# Patient Record
Sex: Female | Born: 2000 | Hispanic: No | Marital: Single | Smoking: Never smoker
Health system: Southern US, Community
[De-identification: ages and names within clinical notes are randomized; demographics above are authoritative.]

## PROBLEM LIST (undated history)

## (undated) DIAGNOSIS — F909 Attention-deficit hyperactivity disorder, unspecified type: Secondary | ICD-10-CM

## (undated) HISTORY — PX: NASAL SEPTUM SURGERY: SHX37

## (undated) HISTORY — PX: TONSILLECTOMY: SUR1361

---
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
## 2020-10-24 ENCOUNTER — Encounter: Payer: Self-pay | Admitting: Sports Medicine

## 2020-10-30 ENCOUNTER — Ambulatory Visit: Payer: Self-pay | Admitting: Sports Medicine

## 2020-11-06 ENCOUNTER — Ambulatory Visit: Payer: Self-pay | Admitting: Sports Medicine

## 2020-11-13 ENCOUNTER — Encounter: Payer: Self-pay | Admitting: Sports Medicine

## 2020-11-13 ENCOUNTER — Other Ambulatory Visit: Payer: Self-pay

## 2020-11-13 ENCOUNTER — Ambulatory Visit (INDEPENDENT_AMBULATORY_CARE_PROVIDER_SITE_OTHER): Payer: Medicaid Other

## 2020-11-13 ENCOUNTER — Ambulatory Visit (INDEPENDENT_AMBULATORY_CARE_PROVIDER_SITE_OTHER): Payer: Medicaid Other | Admitting: Sports Medicine

## 2020-11-13 DIAGNOSIS — M25571 Pain in right ankle and joints of right foot: Secondary | ICD-10-CM

## 2020-11-13 DIAGNOSIS — M25373 Other instability, unspecified ankle: Secondary | ICD-10-CM

## 2020-11-13 DIAGNOSIS — M25472 Effusion, left ankle: Secondary | ICD-10-CM

## 2020-11-13 DIAGNOSIS — M21621 Bunionette of right foot: Secondary | ICD-10-CM | POA: Diagnosis not present

## 2020-11-13 DIAGNOSIS — M216X1 Other acquired deformities of right foot: Secondary | ICD-10-CM

## 2020-11-13 DIAGNOSIS — M25471 Effusion, right ankle: Secondary | ICD-10-CM

## 2020-11-13 DIAGNOSIS — M25572 Pain in left ankle and joints of left foot: Secondary | ICD-10-CM

## 2020-11-13 DIAGNOSIS — M21622 Bunionette of left foot: Secondary | ICD-10-CM

## 2020-11-13 DIAGNOSIS — M216X2 Other acquired deformities of left foot: Secondary | ICD-10-CM | POA: Diagnosis not present

## 2020-11-13 DIAGNOSIS — M79672 Pain in left foot: Secondary | ICD-10-CM

## 2020-11-13 DIAGNOSIS — M778 Other enthesopathies, not elsewhere classified: Secondary | ICD-10-CM

## 2020-11-13 DIAGNOSIS — M79671 Pain in right foot: Secondary | ICD-10-CM

## 2020-11-13 MED ORDER — MELOXICAM 7.5 MG PO TABS: 7.5000 mg | ORAL_TABLET | Freq: Every day | ORAL | 0 refills | Status: AC

## 2020-11-13 NOTE — Progress Notes (Signed)
Subjective:  Rebecca Baxter is a 20 y.o. female patient who presents to office for evaluation of bilateral ankle pain and pain at 5th toe joints L>R. Patient complains of continued pain in the ankle ankles since middle school with sharp pains and weakness.  Reports that her ankles give out easily states that she used to play basketball and had multiple injuries and ankle sprains. Patient has tried over-the-counter braces per mom in the past and heel cushions when she was seen by another doctor with no relief in symptoms. Patient denies any other pedal complaints. Denies recent injury/trip/fall/sprain/any causative factors.  Review of systems noncontributory.   Patient Active Problem List   Diagnosis Date Noted  . Tonsillitis, chronic 08/17/2019  . Bleeding in early pregnancy 08/17/2019  . Pelvic pain affecting pregnancy in first trimester, antepartum 08/17/2019    Current Outpatient Medications on File Prior to Visit  Medication Sig Dispense Refill  . acetaminophen (TYLENOL) 325 MG tablet Take 2 tablets (650 mg total) by mouth every 6 (six) hours as needed for mild pain, moderate pain or fever. 30 tablet 0  . amphetamine-dextroamphetamine (ADDERALL) 30 MG tablet Take 1 tablet by mouth daily.    . cyclobenzaprine (FLEXERIL) 10 MG tablet Take 1 tablet (10 mg total) by mouth 2 (two) times daily as needed for muscle spasms. 10 tablet 0  . fluconazole (DIFLUCAN) 150 MG tablet Take 150 mg by mouth every 3 (three) days.    Marland Kitchen HYDROcodone-acetaminophen (HYCET) 7.5-325 mg/15 ml solution Take by mouth.    Marland Kitchen ibuprofen (ADVIL) 800 MG tablet Take 1 tablet (800 mg total) by mouth 3 (three) times daily. 21 tablet 0  . [facility] 1/35 1-35 MG-MCG tablet Take 1 tablet by mouth daily.    . nitrofurantoin (MACRODANTIN) 100 MG capsule Take 100 mg by mouth 2 (two) times daily.    . ondansetron (ZOFRAN ODT) 4 MG disintegrating tablet Take 1 tablet (4 mg total) by mouth every 8 (eight) hours as needed for nausea or  vomiting. 20 tablet 0  . QUEtiapine (SEROQUEL) 200 MG tablet Take 200 mg by mouth at bedtime.    Marland Kitchen terconazole (TERAZOL 7) 0.4 % vaginal cream Place 1 applicator vaginally at bedtime. 45 g 0   No current facility-administered medications on file prior to visit.    Allergies  Allergen Reactions  . Sulfa Antibiotics Hives and Swelling    Objective:  General: Alert and oriented x3 in no acute distress  Dermatology: No open lesions bilateral lower extremities, no webspace macerations, no ecchymosis bilateral, all nails x 10 are well manicured.  Vascular: Dorsalis Pedis and Posterior Tibial pedal pulses palpable, Capillary Fill Time 3 seconds,(+) pedal hair growth bilateral,trace edema bilateral lower extremities, Temperature gradient within normal limits. + varicosities at ankles.   Neurology: Michaell Cowing sensation intact via light touch bilateral.  Musculoskeletal: No reproducible tenderness to palpation to bilateral ankles at this time. Negative talar tilt, Negative tib-fib stress, subjective instability. No pain with calf compression bilateral. Range of motion within normal limits with mild guarding on ankles and mild guarding with pain to palpation to the fifth metatarsophalangeal joints left greater than right.  Splayfoot type wider at the forefoot as compared to the heel.  Strength within normal limits in all groups bilateral with mild guarding as above.   Gait: Mild antalgic gait  Xrays  Left and right ankle/foot   Impression: No acute osseous findings  Assessment and Plan: Problem List Items Addressed This Visit   None   Visit Diagnoses  Acute bilateral ankle pain    -  Primary   Relevant Orders   DG Foot Complete Right   DG Foot Complete Left   Unstable ankle, unspecified laterality       Swelling of both ankles       Tailor's bunion of both feet       Capsulitis of foot, left       Foot pain, bilateral           -Complete examination performed -Xrays  reviewed -Discussed treatement options for chronic ankle instability and pain and capsulitis at tailor's bunion -Rx meloxicam -Dispensed Surgigrip compression sleeve to wear during the day to help with supporting ankles and to help with swelling -Recommend good supportive shoes daily for foot type -Advised patient to avoid uneven surfaces or high impact activity that could add to worsening of symptoms -Dispensed tailor bunion shield for patient to use as directed when in shoes and to avoid shoes that could be too narrow that could cause rubbing at tailor's bunions -Patient to return to office in 1 month or sooner if condition worsens.  Asencion Islam, DPM

## 2020-11-15 ENCOUNTER — Other Ambulatory Visit: Payer: Self-pay | Admitting: Sports Medicine

## 2020-11-15 DIAGNOSIS — M21621 Bunionette of right foot: Secondary | ICD-10-CM

## 2020-11-15 DIAGNOSIS — M25373 Other instability, unspecified ankle: Secondary | ICD-10-CM

## 2020-12-14 ENCOUNTER — Ambulatory Visit: Payer: Medicaid Other | Admitting: Sports Medicine

## 2020-12-19 ENCOUNTER — Encounter: Payer: Self-pay | Admitting: Sports Medicine

## 2020-12-19 ENCOUNTER — Other Ambulatory Visit: Payer: Self-pay

## 2020-12-19 ENCOUNTER — Ambulatory Visit (INDEPENDENT_AMBULATORY_CARE_PROVIDER_SITE_OTHER): Payer: Medicaid Other | Admitting: Sports Medicine

## 2020-12-19 DIAGNOSIS — G8929 Other chronic pain: Secondary | ICD-10-CM

## 2020-12-19 DIAGNOSIS — M25373 Other instability, unspecified ankle: Secondary | ICD-10-CM

## 2020-12-19 DIAGNOSIS — M25472 Effusion, left ankle: Secondary | ICD-10-CM

## 2020-12-19 DIAGNOSIS — M79671 Pain in right foot: Secondary | ICD-10-CM

## 2020-12-19 DIAGNOSIS — M25572 Pain in left ankle and joints of left foot: Secondary | ICD-10-CM

## 2020-12-19 DIAGNOSIS — M79672 Pain in left foot: Secondary | ICD-10-CM

## 2020-12-19 DIAGNOSIS — M21621 Bunionette of right foot: Secondary | ICD-10-CM

## 2020-12-19 DIAGNOSIS — M21622 Bunionette of left foot: Secondary | ICD-10-CM

## 2020-12-19 DIAGNOSIS — M25571 Pain in right ankle and joints of right foot: Secondary | ICD-10-CM | POA: Diagnosis not present

## 2020-12-19 DIAGNOSIS — M25471 Effusion, right ankle: Secondary | ICD-10-CM | POA: Diagnosis not present

## 2020-12-19 DIAGNOSIS — M19079 Primary osteoarthritis, unspecified ankle and foot: Secondary | ICD-10-CM | POA: Diagnosis not present

## 2020-12-19 MED ORDER — PREDNISONE 10 MG (21) PO TBPK: ORAL_TABLET | ORAL | 0 refills | Status: AC

## 2020-12-19 NOTE — Progress Notes (Signed)
Subjective:  Rebecca Baxter is a 20 y.o. female patient who presents to office for evaluation of bilateral ankle pain and pain at 5th toe joints today R>L. Patient reports that pain is the same. Gets sharp pains in her ankles and the bunions are sore and swell R>L. Stopped mobic after 2 weeks because it wasn't helping.  Patient is assisted by mom this visit.  Patient Active Problem List   Diagnosis Date Noted   Tonsillitis, chronic 08/17/2019   Bleeding in early pregnancy 08/17/2019   Pelvic pain affecting pregnancy in first trimester, antepartum 08/17/2019    Current Outpatient Medications on File Prior to Visit  Medication Sig Dispense Refill   acetaminophen (TYLENOL) 325 MG tablet Take 2 tablets (650 mg total) by mouth every 6 (six) hours as needed for mild pain, moderate pain or fever. 30 tablet 0   amphetamine-dextroamphetamine (ADDERALL) 30 MG tablet Take 1 tablet by mouth daily.     cyclobenzaprine (FLEXERIL) 10 MG tablet Take 1 tablet (10 mg total) by mouth 2 (two) times daily as needed for muscle spasms. 10 tablet 0   fluconazole (DIFLUCAN) 150 MG tablet Take 150 mg by mouth every 3 (three) days.     HYDROcodone-acetaminophen (HYCET) 7.5-325 mg/15 ml solution Take by mouth.     ibuprofen (ADVIL) 800 MG tablet Take 1 tablet (800 mg total) by mouth 3 (three) times daily. 21 tablet 0   KELNOR 1/35 1-35 MG-MCG tablet Take 1 tablet by mouth daily.     meloxicam (MOBIC) 7.5 MG tablet Take 1 tablet (7.5 mg total) by mouth daily. 30 tablet 0   nitrofurantoin (MACRODANTIN) 100 MG capsule Take 100 mg by mouth 2 (two) times daily.     ondansetron (ZOFRAN ODT) 4 MG disintegrating tablet Take 1 tablet (4 mg total) by mouth every 8 (eight) hours as needed for nausea or vomiting. 20 tablet 0   QUEtiapine (SEROQUEL) 200 MG tablet Take 200 mg by mouth at bedtime.     terconazole (TERAZOL 7) 0.4 % vaginal cream Place 1 applicator vaginally at bedtime. 45 g 0   No current facility-administered  medications on file prior to visit.    Allergies  Allergen Reactions   Sulfa Antibiotics Hives and Swelling    Objective:  General: Alert and oriented x3 in no acute distress  Dermatology: No open lesions bilateral lower extremities, no webspace macerations, no ecchymosis bilateral, all nails x 10 are well manicured.  Vascular: Dorsalis Pedis and Posterior Tibial pedal pulses palpable, Capillary Fill Time 3 seconds,(+) pedal hair growth bilateral,trace edema bilateral lower extremities, Temperature gradient within normal limits. + varicosities at ankles.   Neurology: Johney Maine sensation intact via light touch bilateral.  Musculoskeletal: + reproducible tenderness to palpation to bilateral ankles lateral at ATFL/sinus tarsi. Negative talar tilt, Negative tib-fib stress, subjective instability. No pain with calf compression bilateral. Range of motion within normal limits with mild guarding on ankles and mild guarding with pain to palpation to the fifth metatarsophalangeal joints R>L. Splayfoot type wider at the forefoot as compared to the heel.  Strength within normal limits in all groups bilateral with mild guarding as above.   Assessment and Plan: Problem List Items Addressed This Visit   None Visit Diagnoses     Arthritis of foot    -  Primary   Relevant Medications   predniSONE (STERAPRED UNI-PAK 21 TAB) 10 MG (21) TBPK tablet   Other Relevant Orders   Uric acid   Sedimentation rate   C-reactive protein  Rheumatoid factor   HLA-B27 antigen   CBC with Differential/Platelet   ANA, IFA (with reflex)   Unstable ankle, unspecified laterality       Relevant Orders   MR ANKLE LEFT WO CONTRAST   MR ANKLE RIGHT WO CONTRAST   Chronic pain of both ankles       Relevant Orders   MR ANKLE LEFT WO CONTRAST   MR ANKLE RIGHT WO CONTRAST   Swelling of both ankles       Relevant Orders   MR ANKLE LEFT WO CONTRAST   MR ANKLE RIGHT WO CONTRAST   Tailor's bunion of both feet       Foot  pain, bilateral          -Complete examination performed -Re-Discussed treatement options for chronic ankle instability and pain and capsulitis at tailor's bunion -Rx arthritic panel -Rx Prednisone -Rx MRI to eval ankles for chronic sprain injury/tear with instability -Recommend good supportive shoes daily for foot type -Advised good supportive shoes that do not rub -Patient to return to office after MRI and blood work or sooner if condition worsens.  Landis Martins, DPM

## 2021-01-04 ENCOUNTER — Other Ambulatory Visit: Payer: Medicaid Other

## 2021-01-31 ENCOUNTER — Ambulatory Visit
Admission: RE | Admit: 2021-01-31 | Discharge: 2021-01-31 | Disposition: A | Discharge status: Home / Self Care | Payer: Medicaid Other | Source: Ambulatory Visit | Attending: Sports Medicine | Admitting: Sports Medicine

## 2021-01-31 ENCOUNTER — Other Ambulatory Visit: Payer: Self-pay

## 2021-01-31 DIAGNOSIS — G8929 Other chronic pain: Secondary | ICD-10-CM

## 2021-01-31 DIAGNOSIS — M25571 Pain in right ankle and joints of right foot: Secondary | ICD-10-CM

## 2021-01-31 DIAGNOSIS — M25373 Other instability, unspecified ankle: Secondary | ICD-10-CM

## 2021-01-31 DIAGNOSIS — M25471 Effusion, right ankle: Secondary | ICD-10-CM

## 2021-01-31 DIAGNOSIS — M25472 Effusion, left ankle: Secondary | ICD-10-CM

## 2021-01-31 IMAGING — MR MR ANKLE*R* W/O CM
5 series · 38 of 40 positions shown · non-contrast
Comparison: X-ray [DATE]

CLINICAL DATA: Ankle pain, instability suspected, neg xray. Lateral
malleolar pain

EXAM:
MRI OF THE RIGHT ANKLE WITHOUT CONTRAST
TECHNIQUE: Multiplanar, multisequence MR imaging of the ankle was performed. No
intravenous contrast was administered.

[Series 3: T2 fat-sat · axial · 3.0mm · 0.50mm/px · z∈[-58,+63]mm · 9 of 32 slices shown (1 of 2)]
[im 1/32]
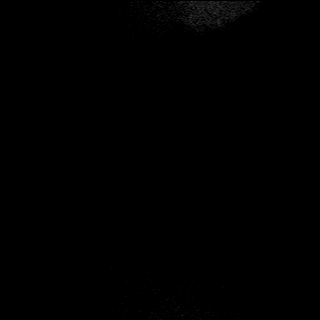
[im 4/32]
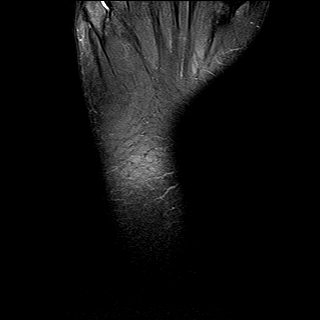
[im 8/32]
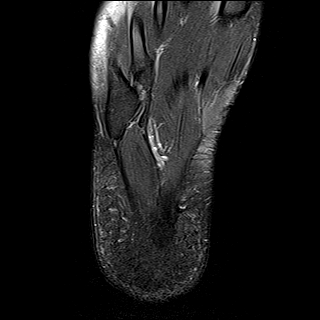
[im 12/32]
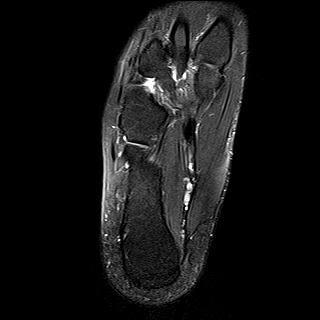
[im 16/32]
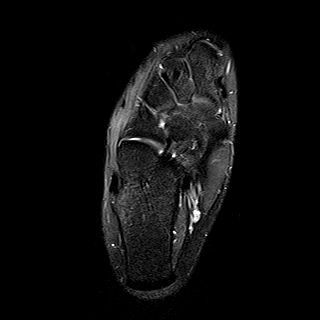
[im 20/32]
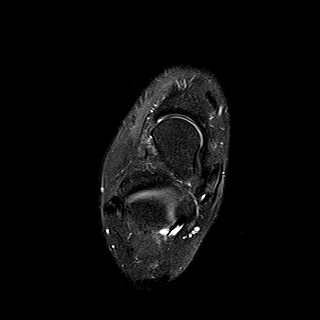
[im 24/32]
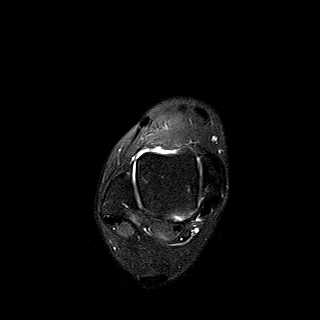
[im 28/32]
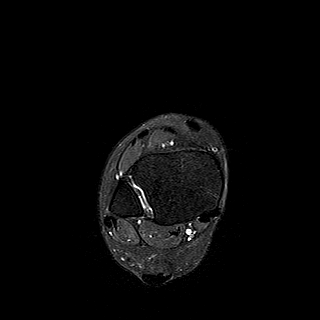
[im 32/32]
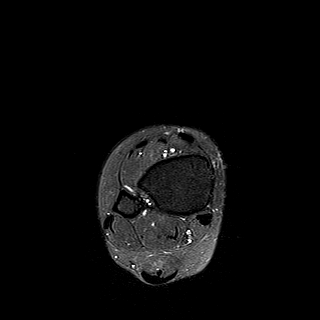

[Series 4: PD fat-sat · axial · 3.0mm · 0.50mm/px · z∈[-58,+63]mm · 9 of 32 slices shown]
[im 1/32]
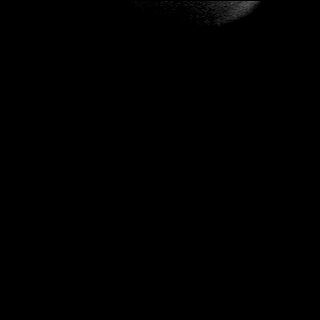
[im 4/32]
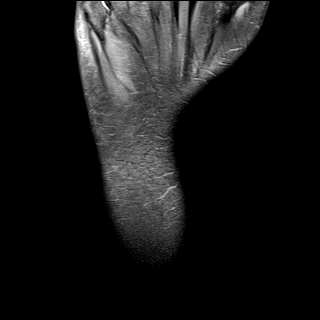
[im 8/32]
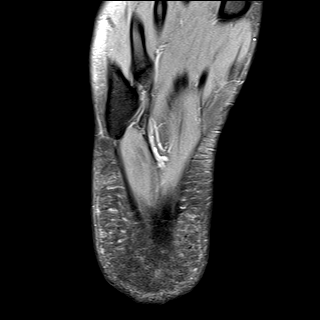
[im 12/32]
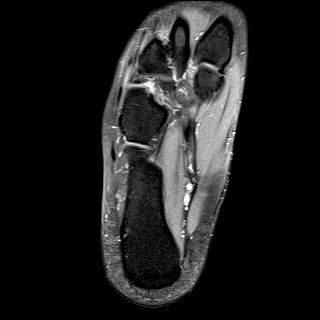
[im 16/32]
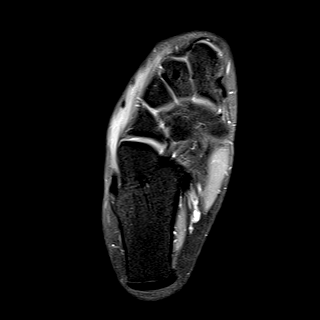
[im 20/32]
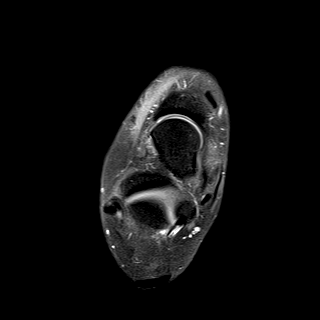
[im 24/32]
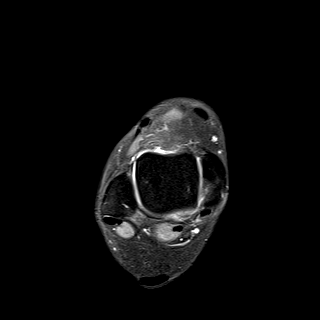
[im 28/32]
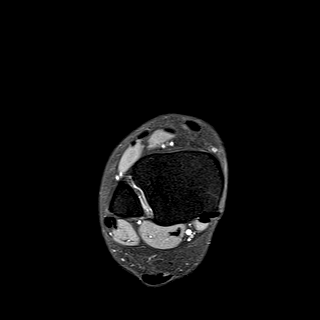
[im 32/32]
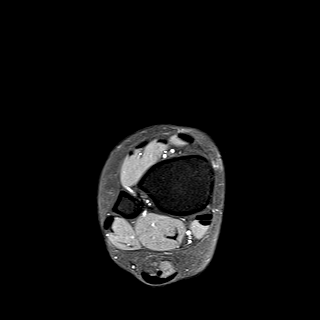

[Series 5: T1 · sagittal · 4.0mm · 0.56mm/px · 6 of 19 slices shown]
[im 1/19]
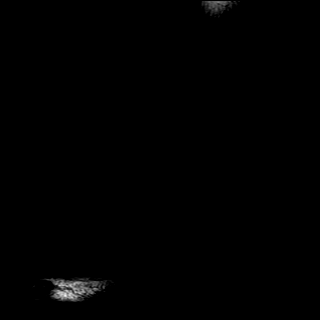
[im 4/19]
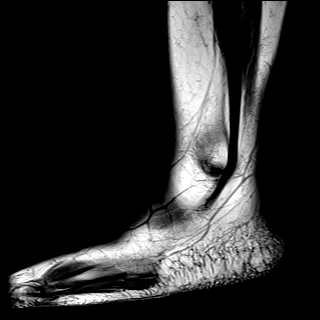
[im 8/19]
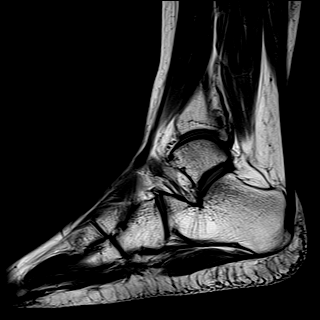
[im 11/19]
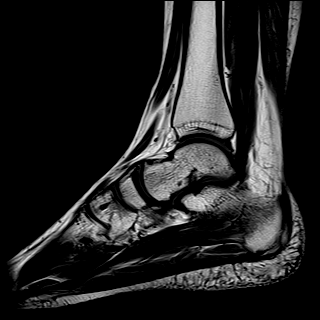
[im 15/19]
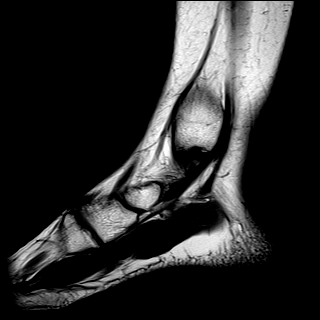
[im 19/19]
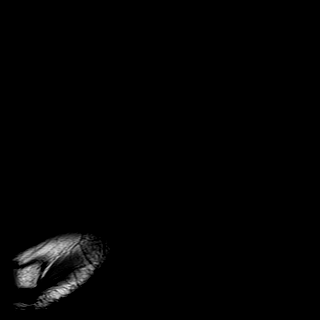

[Series 6: STIR · sagittal · 4.0mm · 0.35mm/px · 6 of 19 slices shown]
[im 1/19]
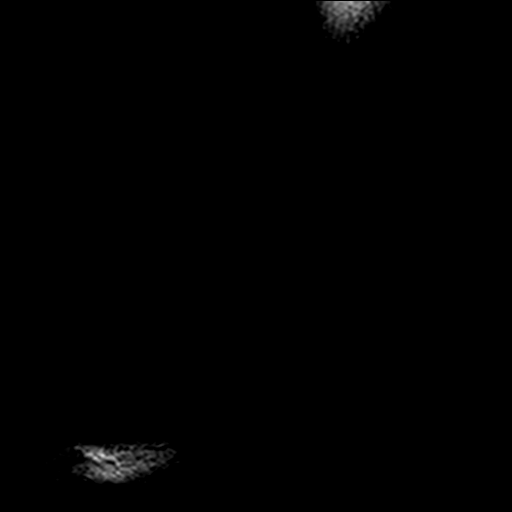
[im 4/19]
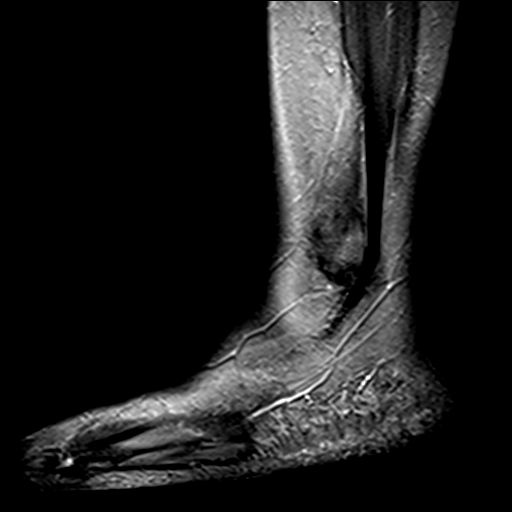
[im 8/19]
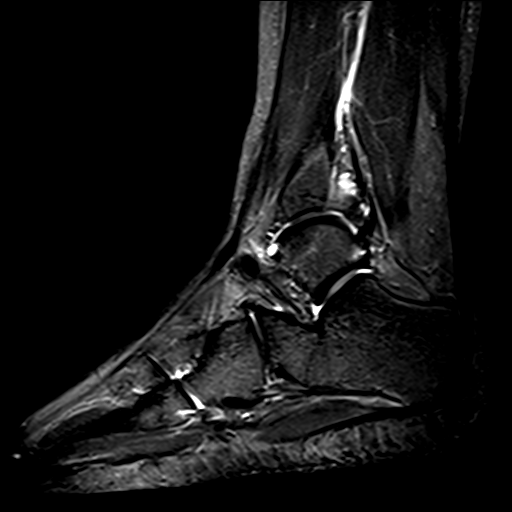
[im 11/19]
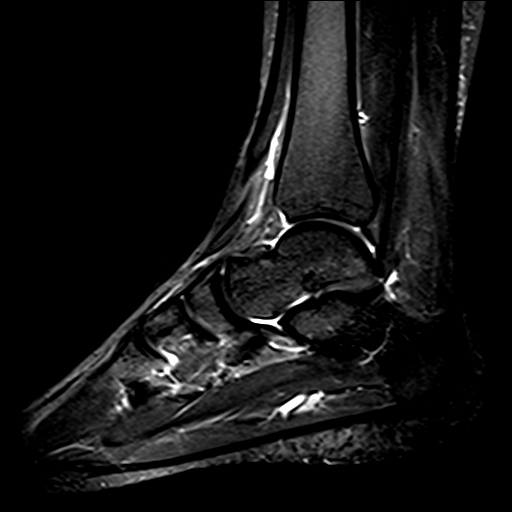
[im 15/19]
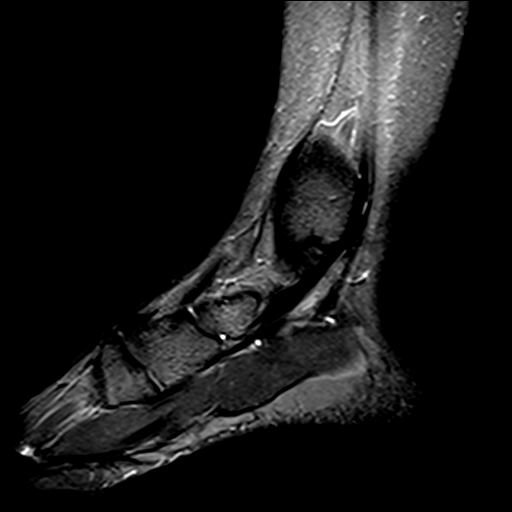
[im 19/19]
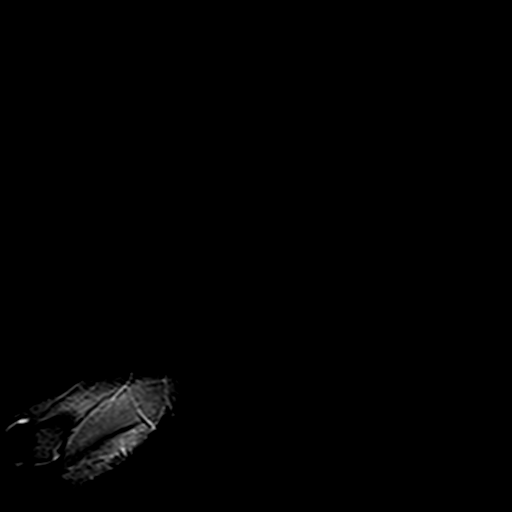

[Series 7: T2 fat-sat · coronal · 3.0mm · 0.50mm/px · 8 of 35 slices shown (2 of 2)]
[im 1/35]
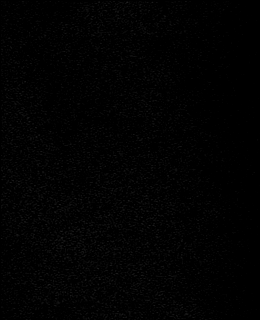
[im 4/35]
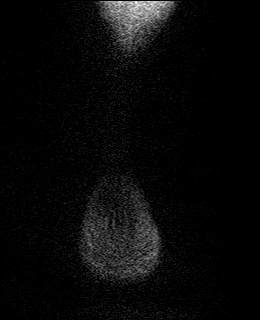
[im 12/35]
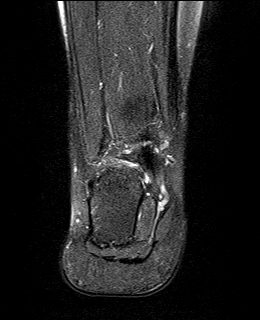
[im 16/35]
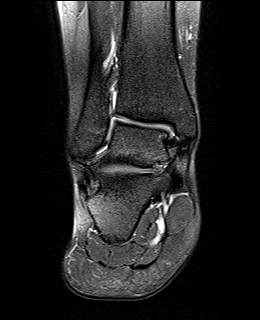
[im 19/35]
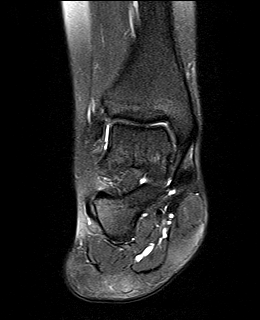
[im 23/35]
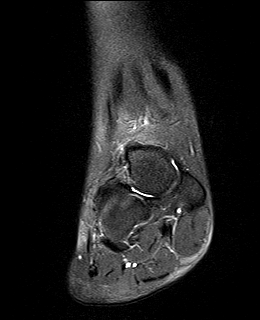
[im 31/35]
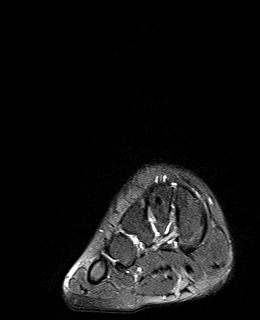
[im 35/35]
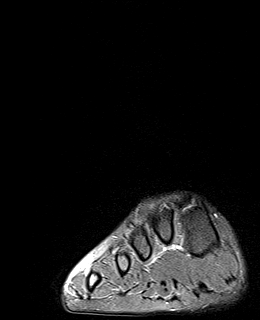

[38 of 40 positions shown; findings below may reference images not displayed]

FINDINGS: TENDONS

Peroneal: Intact peroneus longus and peroneus brevis tendons.

Posteromedial: Intact tibialis posterior, flexor hallucis longus and
flexor digitorum longus tendons.

Anterior: Intact tibialis anterior, extensor hallucis longus and
extensor digitorum longus tendons.

Achilles: Intact.

Plantar Fascia: Intact.

LIGAMENTS

Lateral: Chronically torn anterior talofibular ligament.
Calcaneofibular ligament and posterior talofibular ligaments are
intact but thickened, suggesting sequela of prior trauma. Intact
anterior and posterior tibiofibular ligaments.

Medial: Deltoid ligament and spring ligament complex intact.

CARTILAGE

Ankle Joint: No joint effusion or chondral defect.

Subtalar Joints/Sinus Tarsi: No joint effusion or chondral defect.
Preservation of the anatomic fat within the sinus tarsi.

Bones: No marrow signal abnormality. No fracture or dislocation.

Other: No soft tissue edema or fluid collection.
IMPRESSION: Right ankle:

1. No acute osseous abnormality or evidence of acute ligamentous
injury of the right ankle.
2. Findings compatible with sequela of prior ankle inversion injury
with chronically torn ATFL. The calcaneofibular and posterior
talofibular ligaments are intact but thickened.

## 2021-01-31 IMAGING — MR MR ANKLE*L* W/O CM
5 series · 40 of 40 positions shown · non-contrast
Comparison: X-ray [DATE]

CLINICAL DATA: Ankle pain, instability suspected, neg xray. Pain at
the lateral malleolus

EXAM:
MRI OF THE LEFT ANKLE WITHOUT CONTRAST
TECHNIQUE: Multiplanar, multisequence MR imaging of the ankle was performed. No
intravenous contrast was administered.

[Series 3: T2 fat-sat · axial · 3.0mm · 0.50mm/px · z∈[-51,+69]mm · 10 of 32 slices shown (1 of 2)]
[im 1/32]
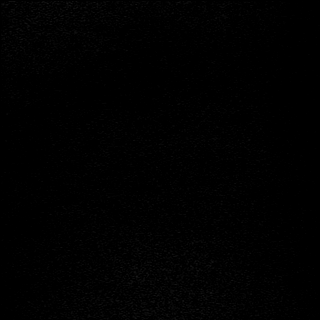
[im 4/32]
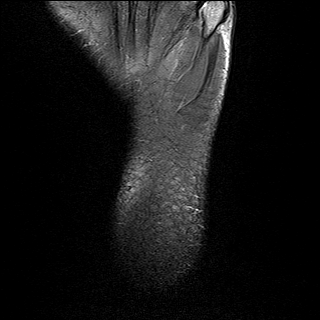
[im 7/32]
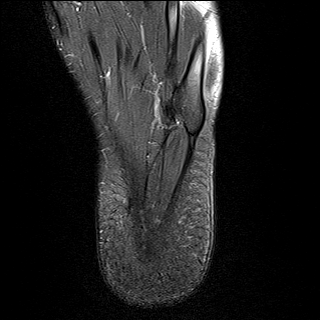
[im 11/32]
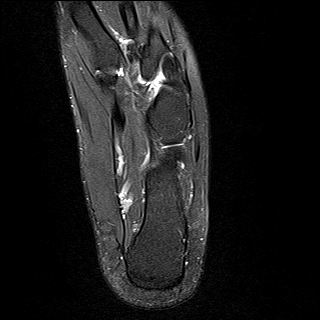
[im 14/32]
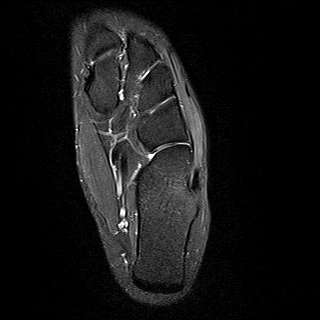
[im 18/32]
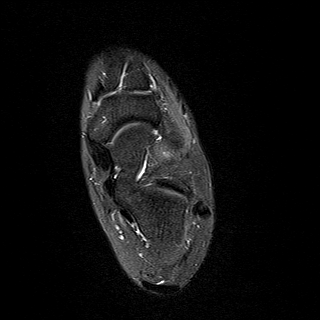
[im 21/32]
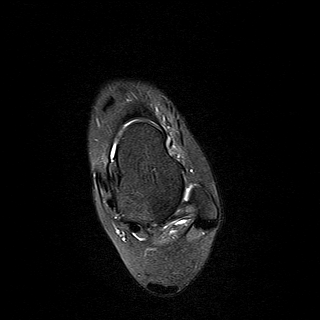
[im 25/32]
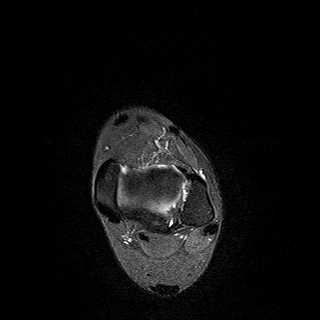
[im 28/32]
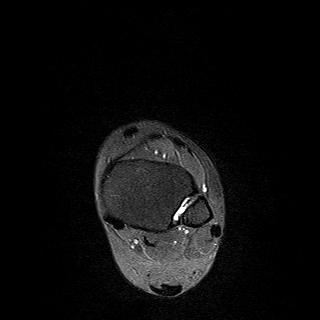
[im 32/32]
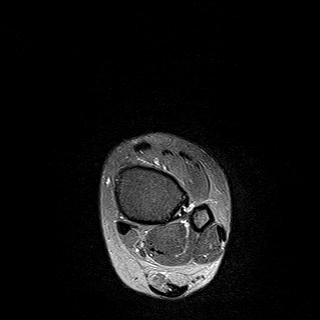

[Series 4: PD fat-sat · axial · 3.0mm · 0.50mm/px · z∈[-51,+69]mm · 10 of 32 slices shown]
[im 1/32]
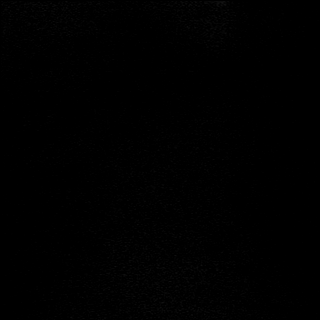
[im 4/32]
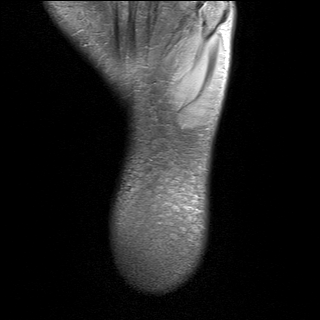
[im 7/32]
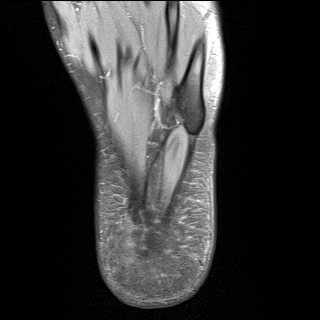
[im 11/32]
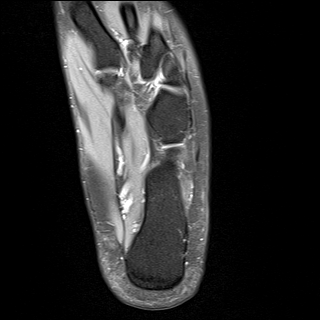
[im 14/32]
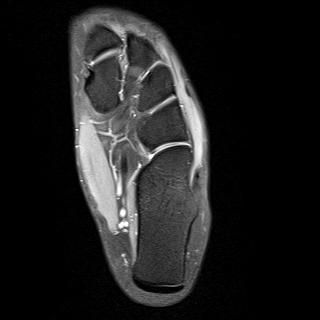
[im 18/32]
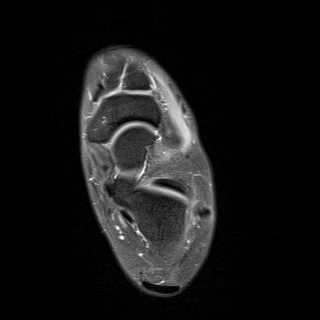
[im 21/32]
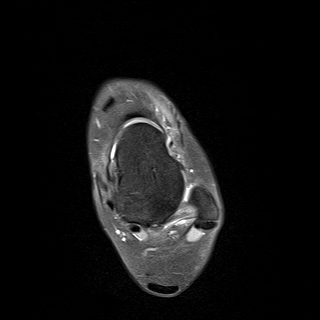
[im 25/32]
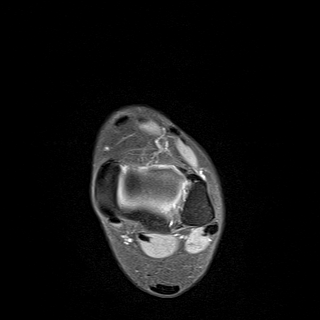
[im 28/32]
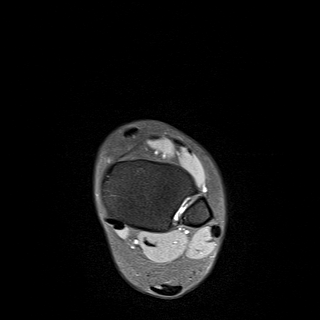
[im 32/32]
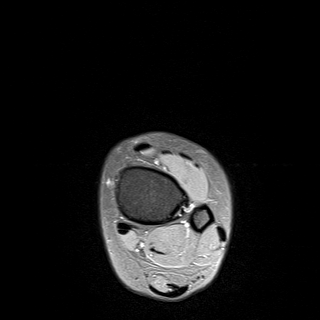

[Series 5: T1 · sagittal · 4.0mm · 0.56mm/px · 5 of 18 slices shown]
[im 1/18]
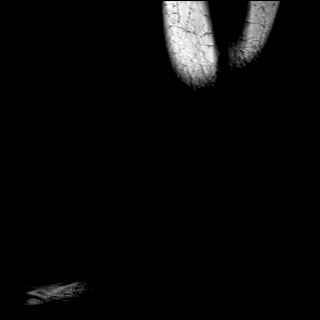
[im 5/18]
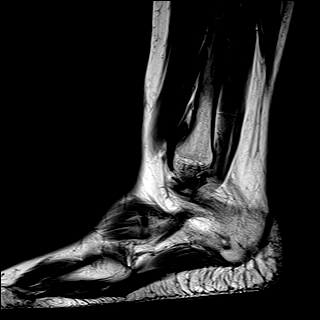
[im 9/18]
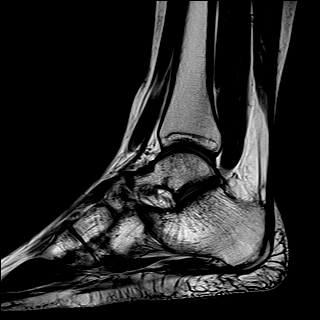
[im 13/18]
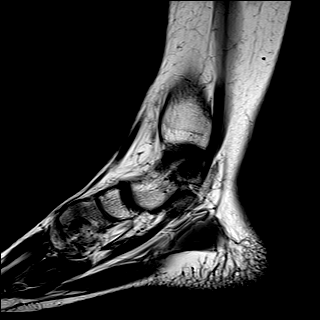
[im 18/18]
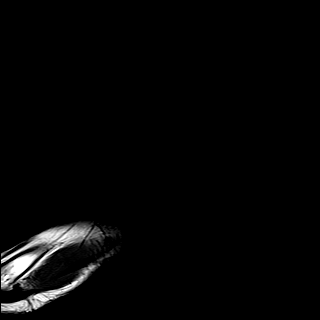

[Series 6: STIR · sagittal · 4.0mm · 0.35mm/px · 5 of 18 slices shown]
[im 1/18]
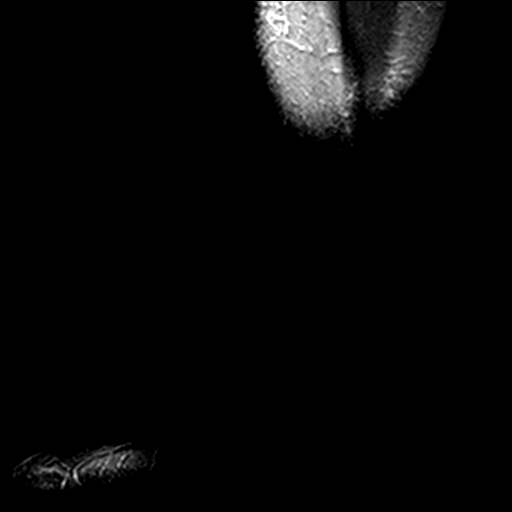
[im 5/18]
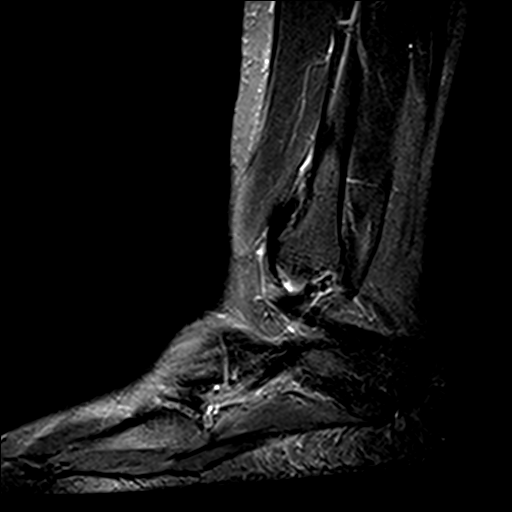
[im 9/18]
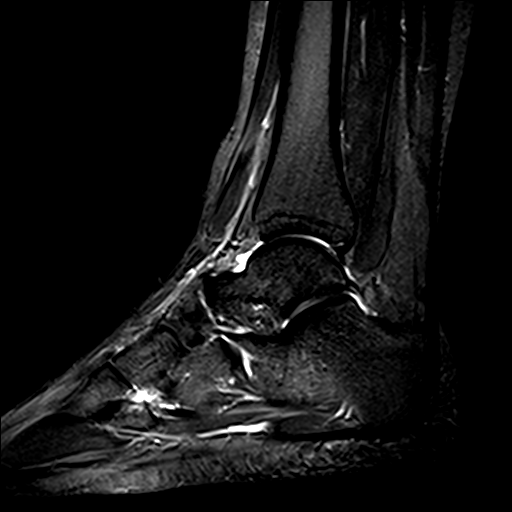
[im 13/18]
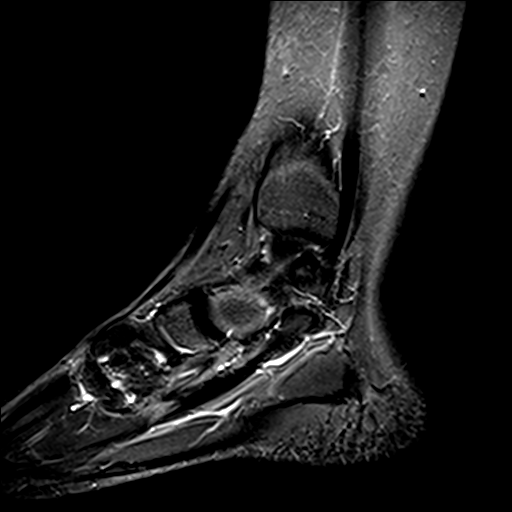
[im 18/18]
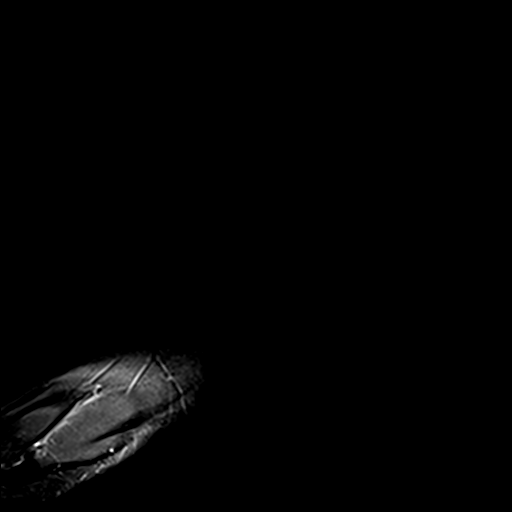

[Series 7: T2 fat-sat · coronal · 3.0mm · 0.50mm/px · 10 of 32 slices shown (2 of 2)]
[im 1/32]
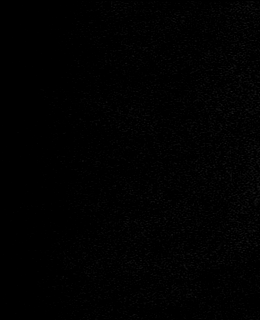
[im 4/32]
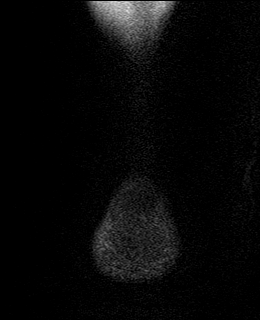
[im 7/32]
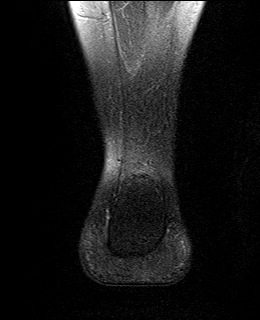
[im 11/32]
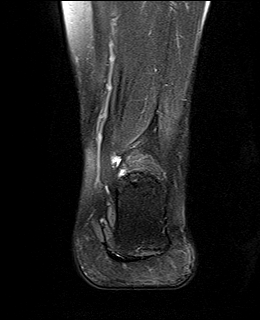
[im 14/32]
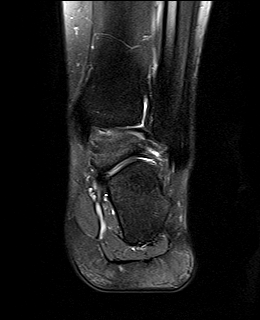
[im 18/32]
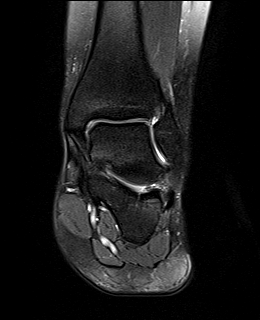
[im 21/32]
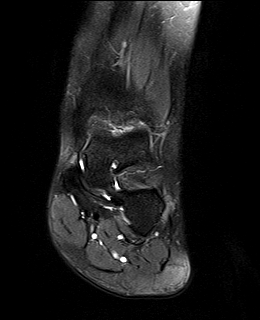
[im 25/32]
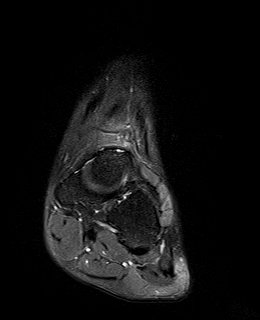
[im 28/32]
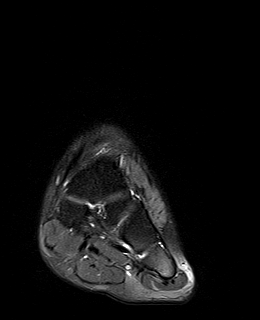
[im 32/32]
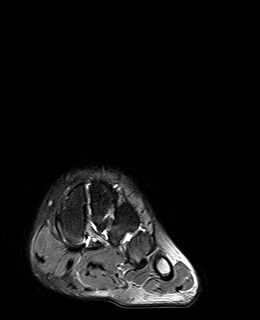

[40 of 40 positions shown; findings below may reference images not displayed]

FINDINGS: TENDONS

Peroneal: Intact peroneus longus and peroneus brevis tendons.

Posteromedial: Intact tibialis posterior, flexor hallucis longus and
flexor digitorum longus tendons.

Anterior: Intact tibialis anterior, extensor hallucis longus and
extensor digitorum longus tendons.

Achilles: Intact.

Plantar Fascia: Intact.

LIGAMENTS

Lateral: Anterior talofibular ligament and calcaneofibular ligaments
are markedly attenuated suggesting sequela of remote trauma. No
periligamentous edema. Posterior talofibular ligament is intact but
mildly thickened. Anterior and posterior tibiofibular ligaments are
intact.

Medial: Deltoid ligament and spring ligament complex intact.

CARTILAGE

Ankle Joint: No joint effusion or chondral defect.

Subtalar Joints/Sinus Tarsi: No joint effusion or chondral defect.
Preservation of the anatomic fat within the sinus tarsi.

Bones: No marrow signal abnormality. No fracture or dislocation.

Other: No soft tissue edema or fluid collection.
IMPRESSION: Left ankle:

1. No acute osseous abnormality or evidence of acute ligamentous
injury of the left ankle.
2. Findings suggest sequela of prior ankle inversion injury with
attenuated appearance of the ATFL and calcaneofibular ligament.
Posterior talofibular ligament is intact but mildly thickened.

## 2022-12-05 ENCOUNTER — Encounter (HOSPITAL_COMMUNITY): Payer: Self-pay | Admitting: Emergency Medicine

## 2022-12-05 ENCOUNTER — Emergency Department (HOSPITAL_COMMUNITY)
Admission: EM | Admit: 2022-12-05 | Discharge: 2022-12-05 | Disposition: A | Discharge status: Home / Self Care | Payer: Medicaid Other | Attending: Emergency Medicine | Admitting: Emergency Medicine

## 2022-12-05 ENCOUNTER — Other Ambulatory Visit: Payer: Self-pay

## 2022-12-05 ENCOUNTER — Emergency Department (HOSPITAL_COMMUNITY): Payer: Medicaid Other

## 2022-12-05 DIAGNOSIS — R1032 Left lower quadrant pain: Secondary | ICD-10-CM | POA: Diagnosis not present

## 2022-12-05 DIAGNOSIS — R112 Nausea with vomiting, unspecified: Secondary | ICD-10-CM | POA: Diagnosis not present

## 2022-12-05 DIAGNOSIS — R1012 Left upper quadrant pain: Secondary | ICD-10-CM | POA: Diagnosis not present

## 2022-12-05 DIAGNOSIS — E876 Hypokalemia: Secondary | ICD-10-CM | POA: Diagnosis not present

## 2022-12-05 DIAGNOSIS — E860 Dehydration: Secondary | ICD-10-CM | POA: Insufficient documentation

## 2022-12-05 DIAGNOSIS — R109 Unspecified abdominal pain: Secondary | ICD-10-CM | POA: Diagnosis present

## 2022-12-05 HISTORY — DX: Attention-deficit hyperactivity disorder, unspecified type: F90.9

## 2022-12-05 LAB — COMPREHENSIVE METABOLIC PANEL
ALT: 29 U/L (ref 0–44)
AST: 34 U/L (ref 15–41)
Albumin: 3.6 g/dL (ref 3.5–5.0)
Alkaline Phosphatase: 75 U/L (ref 38–126)
Anion gap: 16 — ABNORMAL HIGH (ref 5–15)
BUN: 8 mg/dL (ref 6–20)
CO2: 19 mmol/L — ABNORMAL LOW (ref 22–32)
Calcium: 9.2 mg/dL (ref 8.9–10.3)
Chloride: 103 mmol/L (ref 98–111)
Creatinine, Ser: 0.83 mg/dL (ref 0.44–1.00)
GFR, Estimated: >60 mL/min (ref >60)
Glucose, Bld: 109 mg/dL — ABNORMAL HIGH (ref 70–99)
Potassium: 3.4 mmol/L — ABNORMAL LOW (ref 3.5–5.1)
Sodium: 138 mmol/L (ref 135–145)
Total Bilirubin: 0.5 mg/dL (ref 0.3–1.2)
Total Protein: 7.2 g/dL (ref 6.5–8.1)

## 2022-12-05 LAB — URINALYSIS, ROUTINE W REFLEX MICROSCOPIC
Bilirubin Urine: NEGATIVE
Glucose, UA: NEGATIVE mg/dL
Hgb urine dipstick: NEGATIVE
Ketones, ur: NEGATIVE mg/dL
Leukocytes,Ua: NEGATIVE
Nitrite: NEGATIVE
Protein, ur: NEGATIVE mg/dL
Specific Gravity, Urine: 1.016 (ref 1.005–1.030)
pH: 6 (ref 5.0–8.0)

## 2022-12-05 LAB — CBC
HCT: 38.3 % (ref 36.0–46.0)
Hemoglobin: 12.2 g/dL (ref 12.0–15.0)
MCH: 26.3 pg (ref 26.0–34.0)
MCHC: 31.9 g/dL (ref 30.0–36.0)
MCV: 82.7 fL (ref 80.0–100.0)
Platelets: 377 10*3/uL (ref 150–400)
RBC: 4.63 MIL/uL (ref 3.87–5.11)
RDW: 13.9 % (ref 11.5–15.5)
WBC: 11.9 10*3/uL — ABNORMAL HIGH (ref 4.0–10.5)
nRBC: 0 % (ref 0.0–0.2)

## 2022-12-05 LAB — I-STAT BETA HCG BLOOD, ED (MC, WL, AP ONLY): I-stat hCG, quantitative: <5 m[IU]/mL (ref <5)

## 2022-12-05 LAB — LIPASE, BLOOD: Lipase: 31 U/L (ref 11–51)

## 2022-12-05 MED ORDER — DICYCLOMINE HCL 10 MG/ML IM SOLN
20.0000 mg | Freq: Once | INTRAMUSCULAR | Status: AC
  Administered 2022-12-05: 20 mg via INTRAMUSCULAR
  Filled 2022-12-05: qty 2

## 2022-12-05 MED ORDER — ONDANSETRON HCL 4 MG/2ML IJ SOLN
4.0000 mg | Freq: Once | INTRAMUSCULAR | Status: AC
  Administered 2022-12-05: 4 mg via INTRAVENOUS
  Filled 2022-12-05: qty 2

## 2022-12-05 MED ORDER — SODIUM CHLORIDE 0.9 % IV BOLUS
1000.0000 mL | Freq: Once | INTRAVENOUS | Status: AC
  Administered 2022-12-05: 1000 mL via INTRAVENOUS

## 2022-12-05 MED ORDER — PROMETHAZINE HCL 25 MG PO TABS
25.0000 mg | ORAL_TABLET | Freq: Once | ORAL | Status: AC
  Administered 2022-12-05: 25 mg via ORAL
  Filled 2022-12-05: qty 1

## 2022-12-05 MED ORDER — IOHEXOL 350 MG/ML SOLN
75.0000 mL | Freq: Once | INTRAVENOUS | Status: AC | PRN
  Administered 2022-12-05: 75 mL via INTRAVENOUS

## 2022-12-05 MED ORDER — PROMETHAZINE HCL 25 MG PO TABS: 25.0000 mg | ORAL_TABLET | Freq: Three times a day (TID) | ORAL | 0 refills | Status: AC | PRN

## 2022-12-05 MED ORDER — POTASSIUM CHLORIDE CRYS ER 20 MEQ PO TBCR
40.0000 meq | EXTENDED_RELEASE_TABLET | Freq: Once | ORAL | Status: AC
  Administered 2022-12-05: 40 meq via ORAL
  Filled 2022-12-05: qty 2

## 2022-12-05 MED ORDER — DICYCLOMINE HCL 20 MG PO TABS: 20.0000 mg | ORAL_TABLET | Freq: Three times a day (TID) | ORAL | 0 refills | Status: AC | PRN

## 2022-12-05 MED ORDER — KETOROLAC TROMETHAMINE 15 MG/ML IJ SOLN
15.0000 mg | Freq: Once | INTRAMUSCULAR | Status: AC
  Administered 2022-12-05: 15 mg via INTRAVENOUS
  Filled 2022-12-05: qty 1

## 2022-12-05 NOTE — Discharge Instructions (Addendum)
Thank you for letting us take care of you today.  Overall, your workup was reassuring. Your potassium was slightly low. We gave you medication to replace this. We can usually manage this by increasing potassium in the diet which I have provided information about.  Your kidney and liver function look good today. We do not see any other significant electrolyte problems and your urine does not look infected. Your pregnancy test was negative. The CT was normal as well. I recommend following up closely with your PCP and/or GI for further management of your pain and nausea.   I am prescribing Phenergan for home to help with nausea.  I recommend taking the Phenergan and waiting approximately 30 minutes before trying to eat or drink if significantly nauseated.  I am also prescribing Bentyl to help with abdominal pain.  You may take over-the-counter medication such as Tylenol or ibuprofen in addition to this. I referred you to GI. You may call and see if you can get an earlier appointment with this clinic. For any new or worsening symptoms, return to ED for re-evaluation.

## 2022-12-05 NOTE — ED Provider Notes (Signed)
Rebecca Baxter EMERGENCY DEPARTMENT AT [facility] Provider Note   CSN: [id number] Arrival date & time: 12/05/22  4098  History obtained from mother as patient states "I cannot talk right now, I am in pain."  History  Chief Complaint  Patient presents with   Abdominal Pain    Rebecca Baxter is a 22 y.o. female with past medical history ADHD who presents to the ED complaining of left-sided abdominal pain.  Patient states that symptoms have been ongoing for about a month now.  She was evaluated by outpatient provider and referred to GI.  She was unable to get an appointment with GI until next month so she came to the ED today for further evaluation.  No previous imaging.  States that she has associated nausea and vomiting with eating.  Difficulty tolerating p.o. over the last 2 days.  No associated fever, diarrhea, dysuria, vaginal bleeding or discharge.  Before a month ago, no history of this pain.  No previous abdominal surgeries.      Home Medications ADHD medication only  Allergies    Sulfa antibiotics    Review of Systems   Review of Systems  Unable to perform ROS: Other    Physical Exam Updated Vital Signs BP (!) 141/92   Pulse 94   Temp 98.4 F (36.9 C) (Oral)   Resp 16   Wt 97.5 kg   LMP 12/01/2022 (Approximate)   SpO2 100%   BMI 36.90 kg/m  Physical Exam Vitals and nursing note reviewed.  Constitutional:      General: She is not in acute distress.    Appearance: Normal appearance. She is not ill-appearing, toxic-appearing or diaphoretic.  HENT:     Head: Normocephalic and atraumatic.     Mouth/Throat:     Mouth: Mucous membranes are dry.  Eyes:     Conjunctiva/sclera: Conjunctivae normal.  Cardiovascular:     Rate and Rhythm: Normal rate and regular rhythm.     Heart sounds: No murmur heard. Pulmonary:     Effort: Pulmonary effort is normal.     Breath sounds: Normal breath sounds.  Abdominal:     General: Abdomen is flat. There is no distension.      Palpations: Abdomen is soft.     Tenderness: There is abdominal tenderness (LUQ and LLQ). There is no right CVA tenderness, left CVA tenderness, guarding or rebound.  Musculoskeletal:        General: Normal range of motion.     Cervical back: Normal range of motion and neck supple. No rigidity.     Right lower leg: No edema.     Left lower leg: No edema.  Skin:    General: Skin is warm and dry.     Capillary Refill: Capillary refill takes less than 2 seconds.     Coloration: Skin is not jaundiced or pale.     Findings: No rash.  Neurological:     Mental Status: She is alert. Mental status is at baseline.  Psychiatric:        Behavior: Behavior normal.     ED Results / Procedures / Treatments   Labs (all labs ordered are listed, but only abnormal results are displayed) Labs Reviewed  COMPREHENSIVE METABOLIC PANEL - Abnormal; Notable for the following components:      Result Value   Potassium 3.4 (*)    CO2 19 (*)    Glucose, Bld 109 (*)    Anion gap 16 (*)    All other  components within normal limits  CBC - Abnormal; Notable for the following components:   WBC 11.9 (*)    All other components within normal limits  LIPASE, BLOOD  URINALYSIS, ROUTINE W REFLEX MICROSCOPIC  I-STAT BETA HCG BLOOD, ED ([facility], [facility], [facility] ONLY)    EKG None  Radiology CT ABDOMEN PELVIS W CONTRAST  Result Date: 12/05/2022 CLINICAL DATA:  Left lower quadrant abdominal pain. EXAM: CT ABDOMEN AND PELVIS WITH CONTRAST TECHNIQUE: Multidetector CT imaging of the abdomen and pelvis was performed using the standard protocol following bolus administration of intravenous contrast. RADIATION DOSE REDUCTION: This exam was performed according to the departmental dose-optimization program which includes automated exposure control, adjustment of the mA and/or kV according to patient size and/or use of iterative reconstruction technique. CONTRAST:  75mL OMNIPAQUE IOHEXOL 350 MG/ML SOLN COMPARISON:  None Available.  FINDINGS: Lower chest: No acute abnormality. Hepatobiliary: No focal liver abnormality is seen. No gallstones, gallbladder wall thickening, or biliary dilatation. Pancreas: Unremarkable. No pancreatic ductal dilatation or surrounding inflammatory changes. Spleen: Normal in size without focal abnormality. Adrenals/Urinary Tract: Adrenal glands are unremarkable. Kidneys are normal, without renal calculi, focal lesion, or hydronephrosis. Bladder is unremarkable. Stomach/Bowel: Stomach is within normal limits. Appendix appears normal. No evidence of bowel wall thickening, distention, or inflammatory changes. Vascular/Lymphatic: No significant vascular findings are present. No enlarged abdominal or pelvic lymph nodes. Reproductive: Uterus and bilateral adnexa are unremarkable. Other: No abdominal wall hernia or abnormality. No abdominopelvic ascites. Musculoskeletal: No acute or significant osseous findings. IMPRESSION: No CT evidence of acute abdominal/pelvic process. Electronically Signed   By: Larose Hires D.O.   On: 12/05/2022 09:16    Procedures Procedures    Medications Ordered in ED Medications  potassium chloride SA (KLOR-CON M) CR tablet 40 mEq (40 mEq Oral Given 12/05/22 0828)  sodium chloride 0.9 % bolus 1,000 mL (0 mLs Intravenous Stopped 12/05/22 0959)  ondansetron (ZOFRAN) injection 4 mg (4 mg Intravenous Given 12/05/22 0834)  ketorolac (TORADOL) 15 MG/ML injection 15 mg (15 mg Intravenous Given 12/05/22 0834)  dicyclomine (BENTYL) injection 20 mg (20 mg Intramuscular Given 12/05/22 0834)  iohexol (OMNIPAQUE) 350 MG/ML injection 75 mL (75 mLs Intravenous Contrast Given 12/05/22 0857)  promethazine (PHENERGAN) tablet 25 mg (25 mg Oral Given 12/05/22 1308)    ED Course/ Medical Decision Making/ A&P                             Medical Decision Making Amount and/or Complexity of Data Reviewed Labs: ordered. Decision-making details documented in ED Course. Radiology: ordered.  Risk Prescription drug  management.   Medical Decision Making:   Rebecca Baxter is a 22 y.o. female who presented to the ED today with abdominal pain detailed above.    Additional history discussed with patient's family/caregivers.  Complete initial physical exam performed, notably the patient  was in NAD. Moderate LUQ and LLQ abdominal tenderness. Abdomen soft, non-distended. No rebound, guarding, or peritoneal signs. No CVA tenderness. Mildly dry mucus membranes.    Reviewed and confirmed nursing documentation for past medical history, family history, social history.    Initial Assessment:   With the patient's presentation of abdominal pain, differential diagnosis includes but is not limited to AAA, mesenteric ischemia, appendicitis, diverticulitis, DKA, gastritis, gastroenteritis, AMI, nephrolithiasis, pancreatitis, peritonitis, adrenal insufficiency, intestinal ischemia, constipation, UTI, SBO/LBO, splenic rupture, biliary disease, IBD, IBS, PUD, hepatitis, STD, ovarian/testicular torsion, electrolyte disturbance, DKA, dehydration, acute kidney injury, renal failure, cholecystitis, cholelithiasis, choledocholithiasis,  abdominal pain of  unknown etiology, pregnancy, incomplete abortion, septic abortion, threatened abortion, ectopic pregnancy, PID.   Initial Plan:  Screening labs including CBC and Metabolic panel to evaluate for infectious or metabolic etiology of disease.  Lipase to evaluate for pancreatitis Urinalysis with reflex culture ordered to evaluate for UTI or relevant urologic/nephrologic pathology.  CT abd/pelvis to evaluate for intra-abdominal pathology Symptomatic management Objective evaluation as reviewed   Initial Study Results:   Laboratory  All laboratory results reviewed without evidence of clinically relevant pathology.   Exceptions include: K 3.4, WBC 11.9, anion gap 16   Radiology:  All images reviewed independently. Agree with radiology report at this time.   CT ABDOMEN PELVIS W  CONTRAST  Result Date: 12/05/2022 CLINICAL DATA:  Left lower quadrant abdominal pain. EXAM: CT ABDOMEN AND PELVIS WITH CONTRAST TECHNIQUE: Multidetector CT imaging of the abdomen and pelvis was performed using the standard protocol following bolus administration of intravenous contrast. RADIATION DOSE REDUCTION: This exam was performed according to the departmental dose-optimization program which includes automated exposure control, adjustment of the mA and/or kV according to patient size and/or use of iterative reconstruction technique. CONTRAST:  75mL OMNIPAQUE IOHEXOL 350 MG/ML SOLN COMPARISON:  None Available. FINDINGS: Lower chest: No acute abnormality. Hepatobiliary: No focal liver abnormality is seen. No gallstones, gallbladder wall thickening, or biliary dilatation. Pancreas: Unremarkable. No pancreatic ductal dilatation or surrounding inflammatory changes. Spleen: Normal in size without focal abnormality. Adrenals/Urinary Tract: Adrenal glands are unremarkable. Kidneys are normal, without renal calculi, focal lesion, or hydronephrosis. Bladder is unremarkable. Stomach/Bowel: Stomach is within normal limits. Appendix appears normal. No evidence of bowel wall thickening, distention, or inflammatory changes. Vascular/Lymphatic: No significant vascular findings are present. No enlarged abdominal or pelvic lymph nodes. Reproductive: Uterus and bilateral adnexa are unremarkable. Other: No abdominal wall hernia or abnormality. No abdominopelvic ascites. Musculoskeletal: No acute or significant osseous findings. IMPRESSION: No CT evidence of acute abdominal/pelvic process. Electronically Signed   By: Larose Hires D.O.   On: 12/05/2022 09:16     Final Assessment and Plan:   22  year old female presents to ED complaining of left-sided abdominal pain and nausea for the last month.  Has been evaluated by PCP and referred to GI.  No previous imaging.  She has a mild hypokalemia which was repleted.  Urine does not  appear infected.  No AKI, normal liver function.  Appears mildly dehydrated, was given 1 fluid bolus and is tolerating p.o. following antiemetics.  Patient expresses that she has been given Zofran before and has no relief of nausea with this.  Trialed Phenergan and patient remains tolerating p.o.  Will give a short-term prescription for this and have patient closely follow-up with PCP and GI.  Pregnancy test negative, no GU symptoms or fever. Do not suspect PID. Referred to GI to see if she can get an earlier appointment.  CT abdomen pelvis without emergent process.  Discussed all findings with patient and mother. Pt stable for discharge. Given strict ED return precautions and all questions answered.    Clinical Impression:  1. Left upper quadrant abdominal pain   2. Hypokalemia   3. Left lower quadrant abdominal pain   4. Nausea and vomiting, unspecified vomiting type   5. Dehydration      Discharge           Final Clinical Impression(s) / ED Diagnoses Final diagnoses:  Hypokalemia  Left upper quadrant abdominal pain  Left lower quadrant abdominal pain  Nausea and vomiting,  unspecified vomiting type  Dehydration    Rx / DC Orders ED Discharge Orders          Ordered    Ambulatory referral to Gastroenterology        12/05/22 0954    promethazine (PHENERGAN) 25 MG tablet  Every 8 hours PRN        12/05/22 0956    dicyclomine (BENTYL) 20 MG tablet  3 times daily PRN        12/05/22 0956              Tonette Lederer, PA-C 12/05/22 1002    Eber Hong, MD 12/06/22 [phone]

## 2022-12-05 NOTE — ED Triage Notes (Signed)
Pt presents from home for LUQ and LLQ abd pain and emesis for months, worse over the past day. Worse with eating. Improved after vomiting.  Called GI for initial appt but cannot be seen x1 mo.  Denies blood in vomit or stool. No previous evaluations in ER. PCP did not do any imaging or testing.

## 2022-12-05 NOTE — ED Notes (Signed)
Ginger ale given for po challenge
